# Patient Record
Sex: Male | Born: 1975 | Race: White | Hispanic: No | Marital: Single | Smoking: Former smoker
Health system: Southern US, Community
[De-identification: ages and names within clinical notes are randomized; demographics above are authoritative.]

## PROBLEM LIST (undated history)

## (undated) DIAGNOSIS — Z87898 Personal history of other specified conditions: Secondary | ICD-10-CM

## (undated) HISTORY — DX: Personal history of other specified conditions: Z87.898

---

## 2012-08-31 ENCOUNTER — Emergency Department (HOSPITAL_COMMUNITY): Payer: Self-pay

## 2012-08-31 ENCOUNTER — Encounter (HOSPITAL_COMMUNITY): Payer: Self-pay

## 2012-08-31 ENCOUNTER — Emergency Department (HOSPITAL_COMMUNITY)
Admission: EM | Admit: 2012-08-31 | Discharge: 2012-08-31 | Disposition: A | Discharge status: Home / Self Care | Payer: Self-pay | Attending: Emergency Medicine | Admitting: Emergency Medicine

## 2012-08-31 DIAGNOSIS — Y929 Unspecified place or not applicable: Secondary | ICD-10-CM | POA: Insufficient documentation

## 2012-08-31 DIAGNOSIS — IMO0002 Reserved for concepts with insufficient information to code with codable children: Secondary | ICD-10-CM | POA: Insufficient documentation

## 2012-08-31 DIAGNOSIS — Y9389 Activity, other specified: Secondary | ICD-10-CM | POA: Insufficient documentation

## 2012-08-31 DIAGNOSIS — F17200 Nicotine dependence, unspecified, uncomplicated: Secondary | ICD-10-CM | POA: Insufficient documentation

## 2012-08-31 DIAGNOSIS — T18108A Unspecified foreign body in esophagus causing other injury, initial encounter: Secondary | ICD-10-CM | POA: Insufficient documentation

## 2012-08-31 DIAGNOSIS — R1310 Dysphagia, unspecified: Secondary | ICD-10-CM | POA: Insufficient documentation

## 2012-08-31 NOTE — ED Notes (Signed)
Pt back from x-ray.

## 2012-08-31 NOTE — ED Notes (Signed)
Discharge instructions reviewed. Pt verbalized understanding.  

## 2012-08-31 NOTE — ED Provider Notes (Signed)
History     CSN: [id number]  Arrival date & time 08/31/12  6433   First MD Initiated Contact with Patient 08/31/12 [phone]      Chief Complaint  Patient presents with  . Foreign Body    (Consider location/radiation/quality/duration/timing/severity/associated sxs/prior treatment) HPI Comments: This is a 36 year old male, who presents emergency department with chief complaint of foreign body in throat. Patient states that follow a half of a sinus tablet last night, which she believes became lodged in throat. Patient and then trying to expel the capsule by vomiting. He has tried drinking fluids and eating, but since this remains. Patient states that his pain is mild, but it is worsened with swallowing. Patient denies headache, blurred vision, new hearing loss, sore throat, chest pain, shortness of breath, nausea, vomiting, diarrhea, constipation, dysuria, peripheral edema, back pain, numbness or tingling of the extremities.    The history is provided by the patient. No language interpreter was used.    History reviewed. No pertinent past medical history.  History reviewed. No pertinent past surgical history.  No family history on file.  History  Substance Use Topics  . Smoking status: Current Every Day Smoker -- 0.0 packs/day  . Smokeless tobacco: Not on file  . Alcohol Use: No      Review of Systems  All other systems reviewed and are negative.    Allergies  Review of patient's allergies indicates no known allergies.  Home Medications   Current Outpatient Rx  Name  Route  Sig  Dispense  Refill  . TYLENOL ALLERGY SINUS PO   Oral   Take 2 tablets by mouth at bedtime as needed. For congestion           BP 138/83  Pulse 88  Temp 97.5 F (36.4 C) (Oral)  Resp 20  SpO2 99%  Physical Exam  Nursing note and vitals reviewed. Constitutional: He is oriented to person, place, and time. He appears well-developed and well-nourished.  HENT:  Head: Normocephalic and  atraumatic.  Right Ear: External ear normal.  Left Ear: External ear normal.  Nose: Nose normal.  Mouth/Throat: Oropharynx is clear and moist. No oropharyngeal exudate.  Eyes: Conjunctivae normal and EOM are normal. Pupils are equal, round, and reactive to light. Right eye exhibits no discharge. Left eye exhibits no discharge. No scleral icterus.  Neck: Normal range of motion. Neck supple. No JVD present.  Cardiovascular: Normal rate, regular rhythm, normal heart sounds and intact distal pulses.  Exam reveals no gallop and no friction rub.   No murmur heard. Pulmonary/Chest: Effort normal and breath sounds normal. No respiratory distress. He has no wheezes. He has no rales. He exhibits no tenderness.  Abdominal: Soft. Bowel sounds are normal. He exhibits no distension and no mass. There is no tenderness. There is no rebound and no guarding.  Musculoskeletal: Normal range of motion. He exhibits no edema and no tenderness.  Neurological: He is alert and oriented to person, place, and time. He has normal reflexes.       CN 3-12 intact  Skin: Skin is warm and dry.  Psychiatric: He has a normal mood and affect. His behavior is normal. Judgment and thought content normal.    ED Course  Procedures (including critical care time)  Labs Reviewed - No data to display Dg Neck Soft Tissue  08/31/2012  *RADIOLOGY REPORT*  Clinical Data: Foreign body/pill stuck in throat.  NECK SOFT TISSUES - 1+ VIEW  Comparison: None.  Findings: Airway appears patent.  No radiopaque foreign body. Paravertebral soft tissues within normal limits.  No acute osseous finding.  IMPRESSION: No radiopaque foreign body in the neck. Note that many pills are not radiopaque.   Original Report Authenticated By: Jearld Lesch, M.D.      1. Foreign body in esophagus       MDM   36 year old male with sensation of foreign body in throat. Reassurance given. Patient is to followup with ENT if his symptoms do not improve.  Patient understands and agrees with the plan. Return precautions have been given. Patient is stable and ready for discharge.       Roxy Horseman, PA-C 08/31/12 1609

## 2012-08-31 NOTE — ED Notes (Signed)
Pt transported to xray via w/c

## 2012-08-31 NOTE — ED Notes (Signed)
Rob, PA back in with pt.

## 2012-08-31 NOTE — ED Notes (Signed)
Pt states he swallowed half a sinus tablet last night but feels like it is stuck in his throat. Is able to pass water and made himself vomit last night to attempt to pass the pill.

## 2012-09-04 NOTE — ED Provider Notes (Signed)
Medical screening examination/treatment/procedure(s) were conducted as a shared visit with non-physician practitioner(s) and myself.  I personally evaluated the patient during the encounter.  Patient is able to swallow. No airway compromise  Donnetta Hutching, MD 09/04/12 [phone]

## 2014-05-07 ENCOUNTER — Emergency Department (INDEPENDENT_AMBULATORY_CARE_PROVIDER_SITE_OTHER)
Admission: EM | Admit: 2014-05-07 | Discharge: 2014-05-07 | Disposition: A | Discharge status: Home / Self Care | Payer: Self-pay | Source: Home / Self Care | Attending: Emergency Medicine | Admitting: Emergency Medicine

## 2014-05-07 ENCOUNTER — Encounter (HOSPITAL_COMMUNITY): Payer: Self-pay | Admitting: Emergency Medicine

## 2014-05-07 DIAGNOSIS — IMO0001 Reserved for inherently not codable concepts without codable children: Secondary | ICD-10-CM

## 2014-05-07 DIAGNOSIS — T07XXXA Unspecified multiple injuries, initial encounter: Principal | ICD-10-CM

## 2014-05-07 DIAGNOSIS — T148 Other injury of unspecified body region: Secondary | ICD-10-CM

## 2014-05-07 DIAGNOSIS — W57XXXA Bitten or stung by nonvenomous insect and other nonvenomous arthropods, initial encounter: Secondary | ICD-10-CM

## 2014-05-07 DIAGNOSIS — L089 Local infection of the skin and subcutaneous tissue, unspecified: Secondary | ICD-10-CM

## 2014-05-07 MED ORDER — DOXYCYCLINE HYCLATE 100 MG PO CAPS: 100.0000 mg | ORAL_CAPSULE | Freq: Two times a day (BID) | ORAL | Status: DC

## 2014-05-07 MED ORDER — MOMETASONE FUROATE 0.1 % EX CREA: 1.0000 "application " | TOPICAL_CREAM | Freq: Every day | CUTANEOUS | Status: DC

## 2014-05-07 NOTE — ED Notes (Signed)
C/o spider bite on scrotum area which happened Sunday in the woods States a tick was removed on Monday morning States he is feverish and does not feel good

## 2014-05-07 NOTE — ED Provider Notes (Signed)
CSN: [id number]     Arrival date & time 05/07/14  0810 History   First MD Initiated Contact with Patient 05/07/14 0818     Chief Complaint  Patient presents with  . Insect Bite   (Consider location/radiation/quality/duration/timing/severity/associated sxs/prior Treatment) HPI Comments: 38 year old male presents complaining of insect bites to his bilateral legs and scrotum. A few days ago he was walking on a wooded property and he noticed that there are several spider webs around him. At that time, he says that he saw 6 different types of spiders on his shirt. He left the area without incident. The next day he started to have itchy spots on his right groin and on both his legs. The following day he developed some bumps on his testicles that are extremely itchy. He is mainly concerned because he says when he woke up this morning his scrotum was a blue. Additionally he states that the day after he was walking around the property he pulled a tick off his right groin, the tick was embedded, he believes he has removed the entire tick. he has also felt subjective fevers and loss of appetite. Symptoms have been constant and worsening. Other treatments tried at home. No vomiting or abdominal pain, no testicle pain. No measured fever   History reviewed. No pertinent past medical history. History reviewed. No pertinent past surgical history. History reviewed. No pertinent family history. History  Substance Use Topics  . Smoking status: Current Every Day Smoker -- 0.02 packs/day  . Smokeless tobacco: Not on file  . Alcohol Use: No    Review of Systems  Skin: Positive for rash.  All other systems reviewed and are negative.   Allergies  Review of patient's allergies indicates no known allergies.  Home Medications   Prior to Admission medications   Medication Sig Start Date End Date Taking? Authorizing Provider  Chlorphen-Pseudoephed-APAP (TYLENOL ALLERGY SINUS PO) Take 2 tablets by mouth at  bedtime as needed. For congestion    Historical Provider, MD  doxycycline (VIBRAMYCIN) 100 MG capsule Take 1 capsule (100 mg total) by mouth 2 (two) times daily. 05/07/14   Adrian Blackwater Laurelyn Terrero, PA-C  mometasone (ELOCON) 0.1 % cream Apply 1 application topically daily. 05/07/14   Adrian Blackwater Aniya Jolicoeur, PA-C   BP 134/90  Pulse 71  Temp(Src) 97.8 F (36.6 C) (Oral)  Resp 16  SpO2 100% Physical Exam  Nursing note and vitals reviewed. Constitutional: He is oriented to person, place, and time. He appears well-developed and well-nourished. No distress.  HENT:  Head: Normocephalic.  Pulmonary/Chest: Effort normal. No respiratory distress.  Genitourinary: Right testis shows no swelling and no tenderness. Left testis shows no swelling and no tenderness.  The scutum is not blue as the patient suggested, the color is normal  Lymphadenopathy:       Right: No inguinal adenopathy present.       Left: No inguinal adenopathy present.  Neurological: He is alert and oriented to person, place, and time. Coordination normal.  Skin: Skin is warm and dry. Rash noted. Rash is papular (a few 3 mm erythematous papules on the scrotum. On the bilateral lower sugars, there are multiple excoriated papules with very minimal surrounding erythema). He is not diaphoretic.  Psychiatric: He has a normal mood and affect. Judgment normal.    ED Course  Procedures (including critical care time) Labs Review Labs Reviewed - No data to display  Imaging Review No results found.   MDM   1. Insect bites of multiple sites,  infected   2. Tick bite    Given his subjective symptoms coupled with the bites, will cover with doxycycline. Also Elocon cream to help with the itching, and advised Benadryl when necessary.   Meds ordered this encounter  Medications  . doxycycline (VIBRAMYCIN) 100 MG capsule    Sig: Take 1 capsule (100 mg total) by mouth 2 (two) times daily.    Dispense:  20 capsule    Refill:  0    Order Specific  Question:  Supervising Provider    Answer:  Lorenz CoasterKELLER, DAVID C [id number]]  . mometasone (ELOCON) 0.1 % cream    Sig: Apply 1 application topically daily.    Dispense:  50 g    Refill:  0    Order Specific Question:  Supervising Provider    Answer:  Lorenz CoasterKELLER, DAVID C [[id number]]       Graylon GoodZachary H Nadalee Neiswender, PA-C 05/07/14 [phone]

## 2014-05-07 NOTE — ED Provider Notes (Signed)
Medical screening examination/treatment/procedure(s) were performed by non-physician practitioner and as supervising physician I was immediately available for consultation/collaboration.  Leslee Homeavid Courteny Egler, M.D.  Reuben Likesavid C Macaiah Mangal, MD 05/07/14 [phone]

## 2014-05-07 NOTE — Discharge Instructions (Signed)
Insect Bite Mosquitoes, flies, fleas, bedbugs, and many other insects can bite. Insect bites are different from insect stings. A sting is when venom is injected into the skin. Some insect bites can transmit infectious diseases. SYMPTOMS  Insect bites usually turn red, swell, and itch for 2 to 4 days. They often go away on their own. TREATMENT  Your caregiver may prescribe antibiotic medicines if a bacterial infection develops in the bite. HOME CARE INSTRUCTIONS  Do not scratch the bite area.  Keep the bite area clean and dry. Wash the bite area thoroughly with soap and water.  Put ice or cool compresses on the bite area.  Put ice in a plastic bag.  Place a towel between your skin and the bag.  Leave the ice on for 20 minutes, 4 times a day for the first 2 to 3 days, or as directed.  You may apply a baking soda paste, cortisone cream, or calamine lotion to the bite area as directed by your caregiver. This can help reduce itching and swelling.  Only take over-the-counter or prescription medicines as directed by your caregiver.  If you are given antibiotics, take them as directed. Finish them even if you start to feel better. You may need a tetanus shot if:  You cannot remember when you had your last tetanus shot.  You have never had a tetanus shot.  The injury broke your skin. If you get a tetanus shot, your arm may swell, get red, and feel warm to the touch. This is common and not a problem. If you need a tetanus shot and you choose not to have one, there is a rare chance of getting tetanus. Sickness from tetanus can be serious. SEEK IMMEDIATE MEDICAL CARE IF:   You have increased pain, redness, or swelling in the bite area.  You see a red line on the skin coming from the bite.  You have a fever.  You have joint pain.  You have a headache or neck pain.  You have unusual weakness.  You have a rash.  You have chest pain or shortness of breath.  You have abdominal pain,  nausea, or vomiting.  You feel unusually tired or sleepy. MAKE SURE YOU:   Understand these instructions.  Will watch your condition.  Will get help right away if you are not doing well or get worse. Document Released: 10/13/2004 Document Revised: 11/28/2011 Document Reviewed: 04/06/2011 [facility] Patient Information 2015 [place], [place]. This information is not intended to replace advice given to you by your health care provider. Make sure you discuss any questions you have with your health care provider.  Tick Bite Information Ticks are insects that attach themselves to the skin and draw blood for food. There are various types of ticks. Common types include [place] ticks and deer ticks. Most ticks live in shrubs and grassy areas. Ticks can climb onto your body when you make contact with leaves or grass where the tick is waiting. The most common places on the body for ticks to attach themselves are the scalp, neck, armpits, waist, and groin. Most tick bites are harmless, but sometimes ticks carry germs that cause diseases. These germs can be spread to a person during the tick's feeding process. The chance of a disease spreading through a tick bite depends on:   The type of tick.  Time of year.   How long the tick is attached.   Geographic location.  HOW CAN YOU PREVENT TICK BITES? Take these steps to  help prevent tick bites when you are outdoors:  Wear protective clothing. Long sleeves and long pants are best.   Wear white clothes so you can see ticks more easily.  Tuck your pant legs into your socks.   If walking on a trail, stay in the middle of the trail to avoid brushing against bushes.  Avoid walking through areas with long grass.  Put insect repellent on all exposed skin and along boot tops, pant legs, and sleeve cuffs.   Check clothing, hair, and skin repeatedly and before going inside.   Brush off any ticks that are not attached.  Take a shower or bath as soon  as possible after being outdoors.  WHAT IS THE PROPER WAY TO REMOVE A TICK? Ticks should be removed as soon as possible to help prevent diseases caused by tick bites. 1. If latex gloves are available, put them on before trying to remove a tick.  2. Using fine-point tweezers, grasp the tick as close to the skin as possible. You may also use curved forceps or a tick removal tool. Grasp the tick as close to its head as possible. Avoid grasping the tick on its body. 3. Pull gently with steady upward pressure until the tick lets go. Do not twist the tick or jerk it suddenly. This may break off the tick's head or mouth parts. 4. Do not squeeze or crush the tick's body. This could force disease-carrying fluids from the tick into your body.  5. After the tick is removed, wash the bite area and your hands with soap and water or other disinfectant such as alcohol. 6. Apply a small amount of antiseptic cream or ointment to the bite site.  7. Wash and disinfect any instruments that were used.  Do not try to remove a tick by applying a hot match, petroleum jelly, or fingernail polish to the tick. These methods do not work and may increase the chances of disease being spread from the tick bite.  WHEN SHOULD YOU SEEK MEDICAL CARE? Contact your health care provider if you are unable to remove a tick from your skin or if a part of the tick breaks off and is stuck in the skin.  After a tick bite, you need to be aware of signs and symptoms that could be related to diseases spread by ticks. Contact your health care provider if you develop any of the following in the days or weeks after the tick bite:  Unexplained fever.  Rash. A circular rash that appears days or weeks after the tick bite may indicate the possibility of Lyme disease. The rash may resemble a target with a bull's-eye and may occur at a different part of your body than the tick bite.  Redness and swelling in the area of the tick bite.   Tender,  swollen lymph glands.   Diarrhea.   Weight loss.   Cough.   Fatigue.   Muscle, joint, or bone pain.   Abdominal pain.   Headache.   Lethargy or a change in your level of consciousness.  Difficulty walking or moving your legs.   Numbness in the legs.   Paralysis.  Shortness of breath.   Confusion.   Repeated vomiting.  Document Released: 09/02/2000 Document Revised: 06/26/2013 Document Reviewed: 02/13/2013 [facility] Patient Information 2015 [place], [place]. This information is not intended to replace advice given to you by your health care provider. Make sure you discuss any questions you have with your health care provider.

## 2019-08-22 ENCOUNTER — Other Ambulatory Visit: Payer: Self-pay

## 2019-08-22 DIAGNOSIS — Z20822 Contact with and (suspected) exposure to covid-19: Secondary | ICD-10-CM

## 2019-08-24 LAB — NOVEL CORONAVIRUS, NAA: SARS-CoV-2, NAA: NOT DETECTED

## 2021-06-05 ENCOUNTER — Emergency Department
Admission: EM | Admit: 2021-06-05 | Discharge: 2021-06-05 | Disposition: A | Discharge status: Home / Self Care | Payer: Medicaid Other | Attending: Emergency Medicine | Admitting: Emergency Medicine

## 2021-06-05 ENCOUNTER — Emergency Department: Payer: Medicaid Other

## 2021-06-05 ENCOUNTER — Other Ambulatory Visit: Payer: Self-pay

## 2021-06-05 DIAGNOSIS — N50819 Testicular pain, unspecified: Secondary | ICD-10-CM

## 2021-06-05 DIAGNOSIS — R109 Unspecified abdominal pain: Secondary | ICD-10-CM

## 2021-06-05 DIAGNOSIS — N5082 Scrotal pain: Secondary | ICD-10-CM | POA: Diagnosis not present

## 2021-06-05 DIAGNOSIS — R1030 Lower abdominal pain, unspecified: Secondary | ICD-10-CM | POA: Diagnosis present

## 2021-06-05 DIAGNOSIS — F17210 Nicotine dependence, cigarettes, uncomplicated: Secondary | ICD-10-CM | POA: Insufficient documentation

## 2021-06-05 LAB — COMPREHENSIVE METABOLIC PANEL
ALT: 27 U/L (ref 0–44)
AST: 28 U/L (ref 15–41)
Albumin: 4.5 g/dL (ref 3.5–5.0)
Alkaline Phosphatase: 85 U/L (ref 38–126)
Anion gap: 15 (ref 5–15)
BUN: 21 mg/dL — ABNORMAL HIGH (ref 6–20)
CO2: 18 mmol/L — ABNORMAL LOW (ref 22–32)
Calcium: 9.8 mg/dL (ref 8.9–10.3)
Chloride: 104 mmol/L (ref 98–111)
Creatinine, Ser: 1.34 mg/dL — ABNORMAL HIGH (ref 0.61–1.24)
GFR, Estimated: >60 mL/min (ref >60)
Glucose, Bld: 113 mg/dL — ABNORMAL HIGH (ref 70–99)
Potassium: 3.9 mmol/L (ref 3.5–5.1)
Sodium: 137 mmol/L (ref 135–145)
Total Bilirubin: 0.8 mg/dL (ref 0.3–1.2)
Total Protein: 7.5 g/dL (ref 6.5–8.1)

## 2021-06-05 LAB — CBC
HCT: 47.3 % (ref 39.0–52.0)
Hemoglobin: 16.6 g/dL (ref 13.0–17.0)
MCH: 32.4 pg (ref 26.0–34.0)
MCHC: 35.1 g/dL (ref 30.0–36.0)
MCV: 92.4 fL (ref 80.0–100.0)
Platelets: 278 10*3/uL (ref 150–400)
RBC: 5.12 MIL/uL (ref 4.22–5.81)
RDW: 12.1 % (ref 11.5–15.5)
WBC: 13.8 10*3/uL — ABNORMAL HIGH (ref 4.0–10.5)
nRBC: 0 % (ref 0.0–0.2)

## 2021-06-05 LAB — LIPASE, BLOOD: Lipase: 24 U/L (ref 11–51)

## 2021-06-05 MED ORDER — FENTANYL CITRATE PF 50 MCG/ML IJ SOSY
50.0000 ug | PREFILLED_SYRINGE | INTRAMUSCULAR | Status: DC | PRN
  Administered 2021-06-05: 50 ug via INTRAVENOUS
  Filled 2021-06-05: qty 1

## 2021-06-05 MED ORDER — ONDANSETRON HCL 4 MG/2ML IJ SOLN
4.0000 mg | Freq: Once | INTRAMUSCULAR | Status: AC
  Administered 2021-06-05: 4 mg via INTRAVENOUS
  Filled 2021-06-05: qty 2

## 2021-06-05 NOTE — Discharge Instructions (Addendum)
You have only had a partial evaluation of your flank pain. You have decided to leave before a more comprehensive evaluation can be completed. You do so at risk of your condition worsening. Please consider return to the ED for re-evaluation if your symptoms return or persist.

## 2021-06-05 NOTE — ED Triage Notes (Signed)
Pt comes pov with left sided abd pain that goes into left groin and into back. Denies hx of stones or torsion. Started 2 hours ago. Pt shaking, slow to answer questions due to the pain.

## 2021-06-05 NOTE — ED Notes (Signed)
Taken to US.

## 2021-06-06 NOTE — ED Provider Notes (Signed)
[facility] Emergency Department Provider Note ____________________________________________  Time seen: 1506  I have reviewed the triage vital signs and the nursing notes.  HISTORY  Chief Complaint  Abdominal Pain, Flank Pain, and Groin Pain   HPI Dan Blankenship is a 45 y.o. male presents himself to the ED for evaluation of acute onset of left-sided abdominal and flank pain.  Patient reports referral of his pain into his left groin and scrotum.  He denies any history of kidney stones, testicular trauma, or testicular torsion.  He reports onset of symptoms about 2 hours prior to arrival.  Patient presents in significant pain and distress, slow to answer questions, shaking secondary to pain.  He denies any nausea, vomiting, hematuria, or dysuria.   History reviewed. No pertinent past medical history.  There are no problems to display for this patient.   History reviewed. No pertinent surgical history.  Prior to Admission medications   Medication Sig Start Date End Date Taking? Authorizing Provider  Chlorphen-Pseudoephed-APAP (TYLENOL ALLERGY SINUS PO) Take 2 tablets by mouth at bedtime as needed. For congestion    [provider]  doxycycline (VIBRAMYCIN) 100 MG capsule Take 1 capsule (100 mg total) by mouth 2 (two) times daily. 05/07/14   Baker, Earna Coder H, PA-C  mometasone (ELOCON) 0.1 % cream Apply 1 application topically daily. 05/07/14   Graylon Good, PA-C    Allergies Patient has no known allergies.  History reviewed. No pertinent family history.  Social History Social History   Tobacco Use   Smoking status: Every Day    Packs/day: 0.02    Types: Cigarettes  Substance Use Topics   Alcohol use: No   Drug use: No    Review of Systems  Constitutional: Negative for fever. Eyes: Negative for visual changes. ENT: Negative for sore throat. Cardiovascular: Negative for chest pain. Respiratory: Negative for shortness of  breath. Gastrointestinal: Negative for abdominal pain, vomiting and diarrhea. Left flank pain.  Genitourinary: Negative for dysuria. Left scrotal pain Musculoskeletal: Negative for back pain. Skin: Negative for rash. Neurological: Negative for headaches, focal weakness or numbness. ____________________________________________  PHYSICAL EXAM:  VITAL SIGNS: ED Triage Vitals  Enc Vitals Group     BP 06/05/21 1044 (!) 152/106     Pulse Rate 06/05/21 1044 (!) 108     Resp 06/05/21 1044 (!) 25     Temp 06/05/21 1520 97.8 F (36.6 C)     Temp Source 06/05/21 1520 Oral     SpO2 06/05/21 1044 96 %     Weight 06/05/21 1044 160 lb (72.6 kg)     Height 06/05/21 1044 5\' 6"  (1.676 m)     Head Circumference --      Peak Flow --      Pain Score 06/05/21 1044 10     Pain Loc --      Pain Edu? --      Excl. in GC? --     Constitutional: Alert and oriented. Well appearing and in no distress. Head: Normocephalic and atraumatic. Eyes: Conjunctivae are normal. Normal extraocular movements Cardiovascular: Normal rate, regular rhythm. Normal distal pulses. Respiratory: Normal respiratory effort. No wheezes/rales/rhonchi. Gastrointestinal: Soft and nontender. No distention.  Normal bowel sounds appreciated.  No flank pain elicited. Musculoskeletal: Nontender with normal range of motion in all extremities.  Neurologic:  Normal gait without ataxia. Normal speech and language. No gross focal neurologic deficits are appreciated. Skin:  Skin is warm, dry and intact. No rash noted. Psychiatric: Mood and affect  are normal. Patient exhibits appropriate insight and judgment. ____________________________________________    {LABS (pertinent positives/negatives)  Labs Reviewed  CBC - Abnormal; Notable for the following components:      Result Value   WBC 13.8 (*)    All other components within normal limits  COMPREHENSIVE METABOLIC PANEL - Abnormal; Notable for the following components:   CO2 18 (*)     Glucose, Bld 113 (*)    BUN 21 (*)    Creatinine, Ser 1.34 (*)    All other components within normal limits  LIPASE, BLOOD  ____________________________________________  {EKG  ____________________________________________   RADIOLOGY Official radiology report(s):   US Scrotum w/ Doppler  IMPRESSION: 1. No evidence of testicular torsion. Normal size and ultrasound appearance of the testicles. Arterial and venous Doppler flow is present bilaterally. 2. Diffuse bilateral scrotal thickening. Correlate for local evidence of infection or inflammation. ____________________________________________  PROCEDURES  Fentanyl 50 mcg IVP Zofran 4 mg IVP  Procedures ____________________________________________   INITIAL IMPRESSION / ASSESSMENT AND PLAN / ED COURSE  As part of my medical decision making, I reviewed the following data within the electronic MEDICAL RECORD NUMBER Labs reviewed as noted, Radiograph reviewed as noted, and Notes from prior ED visits   DDX: testicular torsion, nephrolithiasis, urethritis  Patient ED evaluation of acute onset left flank and testicular pain, evaluated for the ED emergently.  He had an ultrasound performed which did not indicate any acute torsion but did show some bilateral scrotal thickening.  Patient reported improvement of his pain significantly at the time of this evaluation.  He did however declined any further imaging, and decided to leave before his full work-up being completed.  Patient was aware that he was leaving against best medical advice, as he could still have a serious underlying infection causing his symptoms.  Patient was discharged and encouraged to return to the ED soon as possible.  Dan Blankenship was evaluated in Emergency Department on 06/06/2021 for the symptoms described in the history of present illness. He was evaluated in the context of the global COVID-19 pandemic, which necessitated consideration that the patient might be at risk for  infection with the SARS-CoV-2 virus that causes COVID-19. Institutional protocols and algorithms that pertain to the evaluation of patients at risk for COVID-19 are in a state of rapid change based on information released by regulatory bodies including the CDC and federal and state organizations. These policies and algorithms were followed during the patient's care in the ED. ____________________________________________  FINAL CLINICAL IMPRESSION(S) / ED DIAGNOSES  Final diagnoses:  Left flank pain  Scrotal pain      Keenya Matera, Charlesetta Ivory, PA-C 06/06/21 2147    Dionne Bucy, MD 06/14/21 0710

## 2022-10-01 IMAGING — US US SCROTUM W/ DOPPLER COMPLETE
1 series · 13 of 25 positions shown · non-contrast
Comparison: None.

CLINICAL DATA: Testicular pain for 1 day, rule out torsion

EXAM:
SCROTAL ULTRASOUND
DOPPLER ULTRASOUND OF THE TESTICLES
TECHNIQUE: Complete ultrasound examination of the testicles, epididymis, and
other scrotal structures was performed. Color and spectral Doppler
ultrasound were also utilized to evaluate blood flow to the
testicles.

[Series 1: us scrotum w/doppler · 13 of 90 slices shown]
[im 1/90]
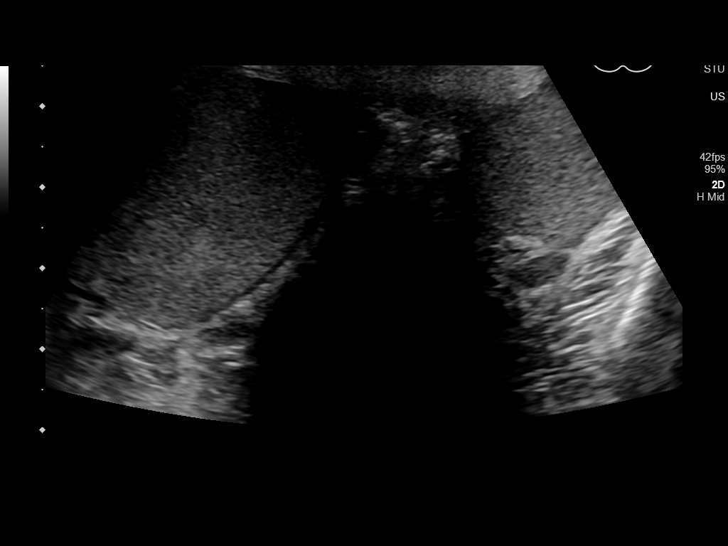
[im 8/90]
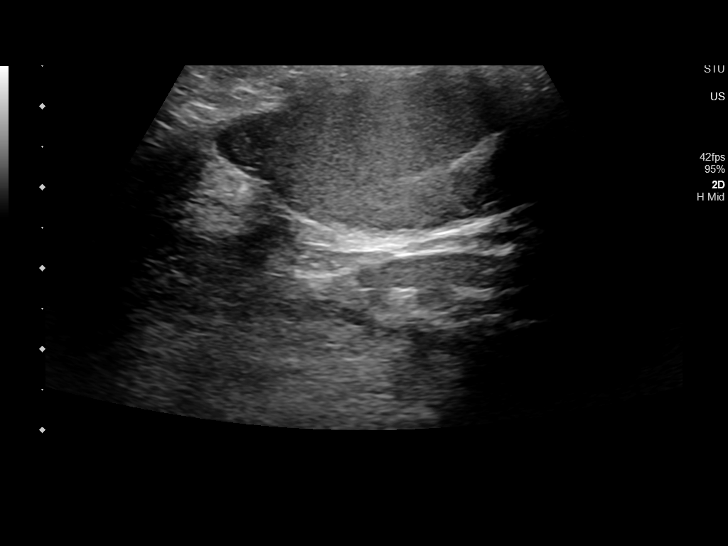
[im 15/90]
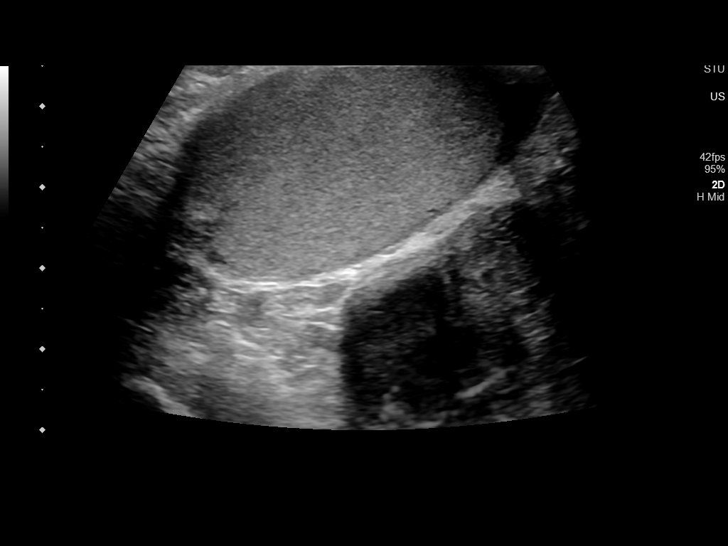
[im 23/90]
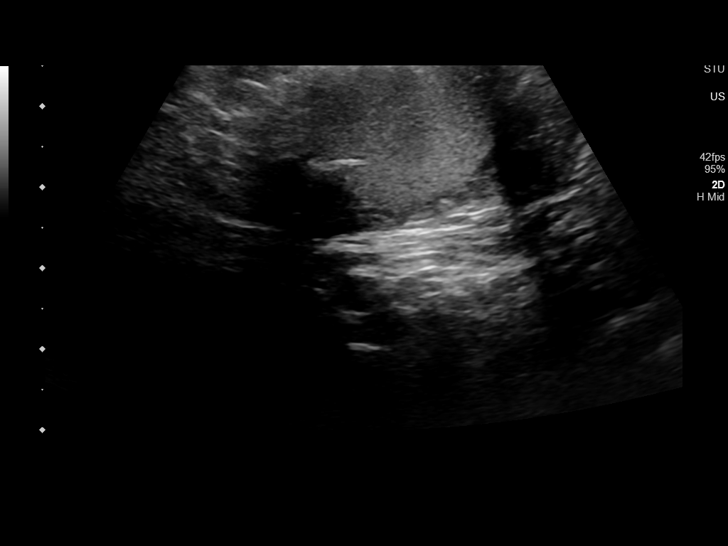
[im 30/90]
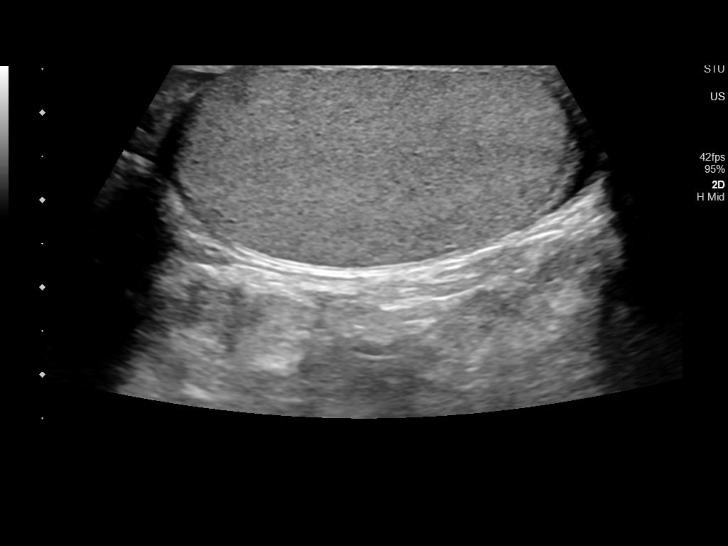
[im 38/90]
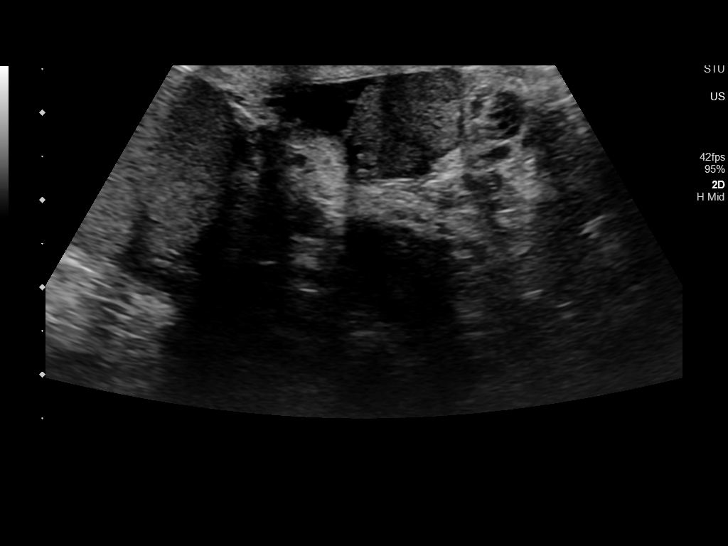
[im 45/90]
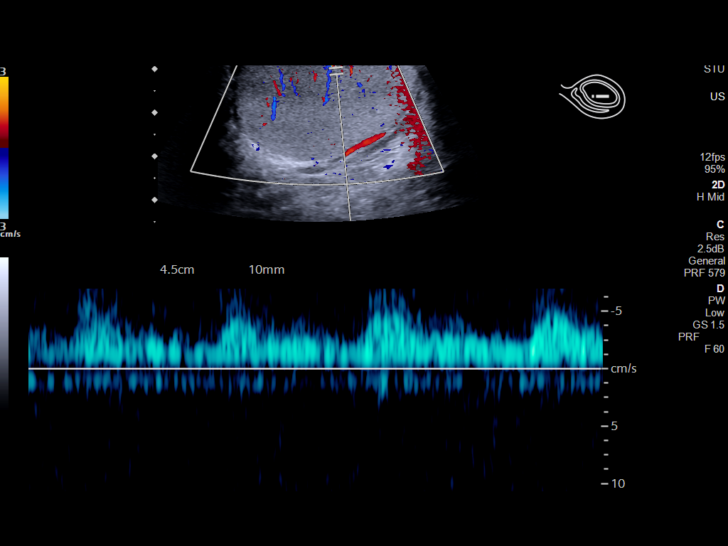
[im 52/90]
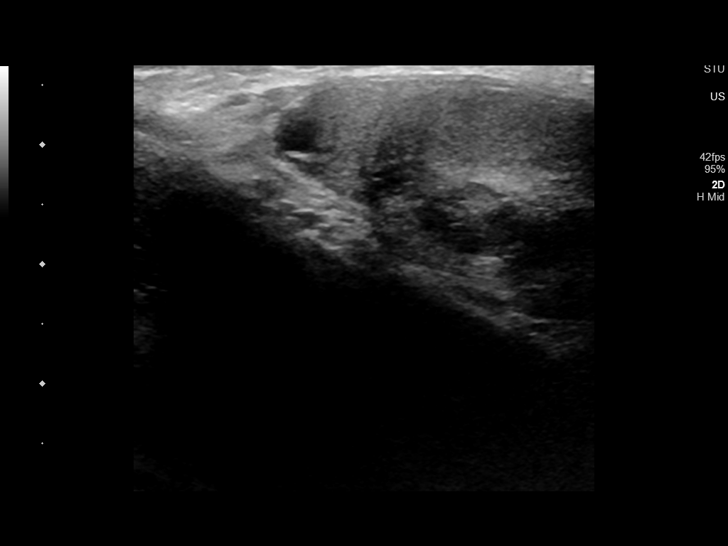
[im 60/90]
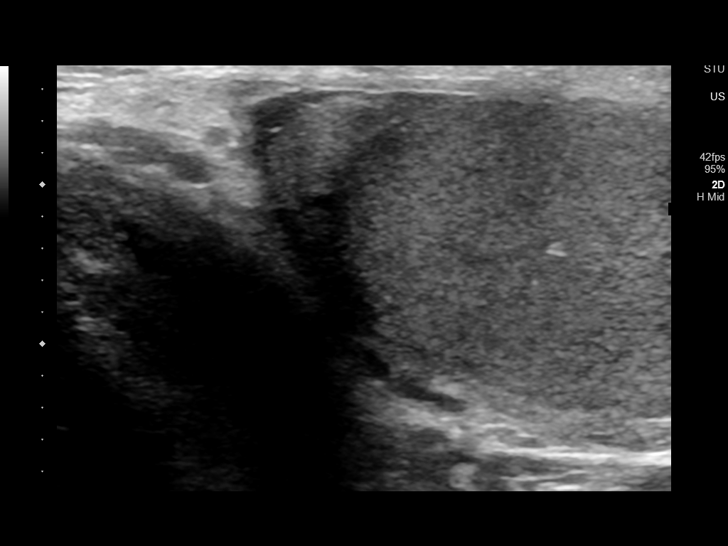
[im 67/90]
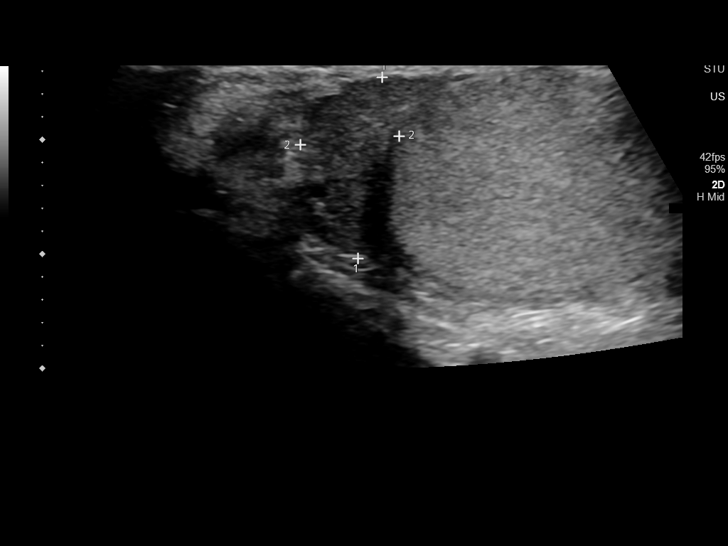
[im 75/90]
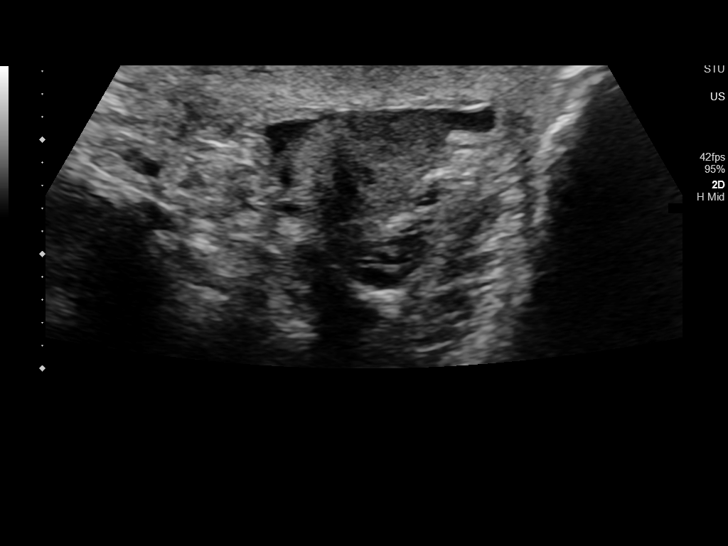
[im 82/90]
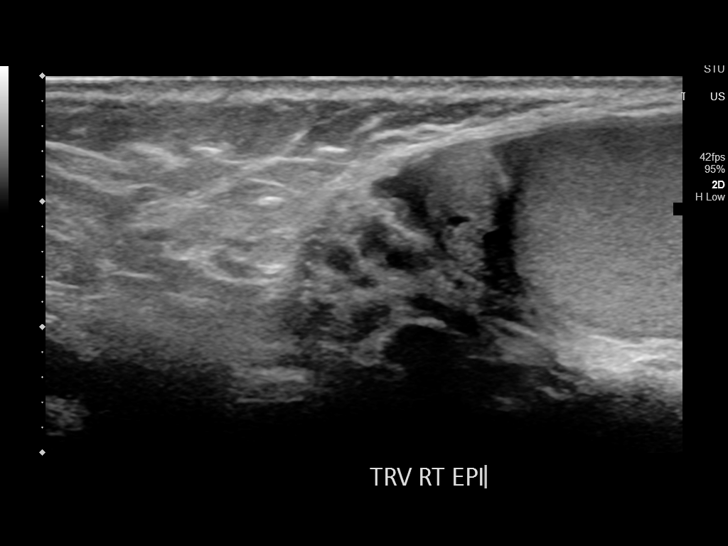
[im 90/90]
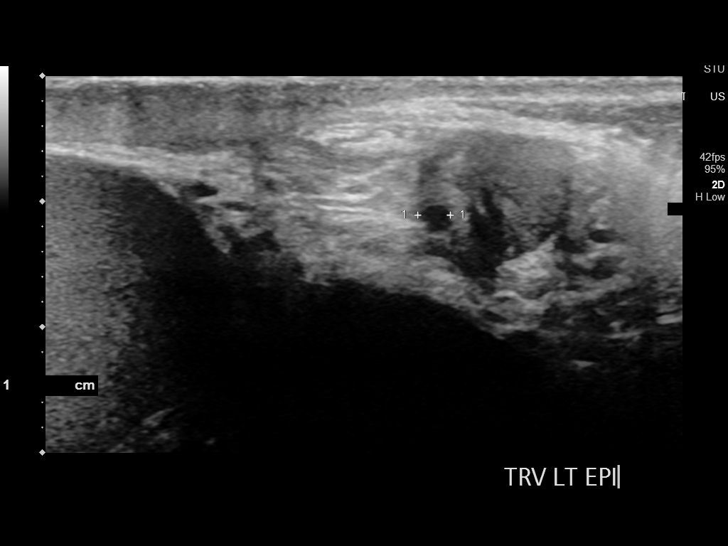

[13 of 25 positions shown; findings below may reference images not displayed]

FINDINGS: Right testicle

Measurements: 4.1 x 2.2 x 3.9 cm. No mass or microlithiasis
visualized.

Left testicle

Measurements: 4.2 x 2.6 x 3.1 cm. No mass or microlithiasis
visualized.

Right epididymis: Normal in size and appearance. Incidental benign
tiny subcentimeter cyst or spermatocele.

Left epididymis: Normal in size and appearance. Incidental benign
tiny subcentimeter cyst or spermatocele.

Hydrocele:  None visualized.

Varicocele:  None visualized.

Pulsed Doppler interrogation of both testes demonstrates normal low
resistance arterial and venous waveforms bilaterally.

Other: Diffuse bilateral scrotal thickening.
IMPRESSION: 1. No evidence of testicular torsion. Normal size and ultrasound
appearance of the testicles. Arterial and venous Doppler flow is
present bilaterally.
2. Diffuse bilateral scrotal thickening. Correlate for local
evidence of infection or inflammation.

## 2023-04-13 DIAGNOSIS — H5213 Myopia, bilateral: Secondary | ICD-10-CM | POA: Diagnosis not present

## 2023-12-01 ENCOUNTER — Ambulatory Visit: Payer: BLUE CROSS/BLUE SHIELD | Admitting: Family Medicine

## 2023-12-01 ENCOUNTER — Other Ambulatory Visit (HOSPITAL_COMMUNITY)
Admission: RE | Admit: 2023-12-01 | Discharge: 2023-12-01 | Disposition: A | Discharge status: Home / Self Care | Source: Ambulatory Visit | Attending: Family Medicine | Admitting: Family Medicine

## 2023-12-01 ENCOUNTER — Encounter: Payer: Self-pay | Admitting: Family Medicine

## 2023-12-01 VITALS — BP 134/85 | HR 76 | Ht 64.0 in | Wt 177.3 lb

## 2023-12-01 DIAGNOSIS — Z113 Encounter for screening for infections with a predominantly sexual mode of transmission: Secondary | ICD-10-CM

## 2023-12-01 DIAGNOSIS — R030 Elevated blood-pressure reading, without diagnosis of hypertension: Secondary | ICD-10-CM | POA: Diagnosis not present

## 2023-12-01 DIAGNOSIS — M79621 Pain in right upper arm: Secondary | ICD-10-CM

## 2023-12-01 DIAGNOSIS — E66811 Obesity, class 1: Secondary | ICD-10-CM | POA: Diagnosis not present

## 2023-12-01 DIAGNOSIS — Z87898 Personal history of other specified conditions: Secondary | ICD-10-CM

## 2023-12-01 DIAGNOSIS — E6609 Other obesity due to excess calories: Secondary | ICD-10-CM | POA: Diagnosis not present

## 2023-12-01 DIAGNOSIS — M79622 Pain in left upper arm: Secondary | ICD-10-CM

## 2023-12-01 DIAGNOSIS — Z7689 Persons encountering health services in other specified circumstances: Secondary | ICD-10-CM

## 2023-12-01 DIAGNOSIS — Z6830 Body mass index (BMI) 30.0-30.9, adult: Secondary | ICD-10-CM | POA: Diagnosis not present

## 2023-12-01 DIAGNOSIS — Z1159 Encounter for screening for other viral diseases: Secondary | ICD-10-CM

## 2023-12-01 DIAGNOSIS — M25562 Pain in left knee: Secondary | ICD-10-CM

## 2023-12-01 DIAGNOSIS — G8929 Other chronic pain: Secondary | ICD-10-CM | POA: Diagnosis not present

## 2023-12-01 DIAGNOSIS — Z114 Encounter for screening for human immunodeficiency virus [HIV]: Secondary | ICD-10-CM | POA: Diagnosis not present

## 2023-12-01 DIAGNOSIS — Z1211 Encounter for screening for malignant neoplasm of colon: Secondary | ICD-10-CM

## 2023-12-01 NOTE — Progress Notes (Signed)
 New Patient Office Visit  Introduced to nurse practitioner role and practice setting.  All questions answered.  Discussed provider/patient relationship and expectations.   Subjective    Patient ID: Dan Blankenship, male    DOB: [birth year]  Age: 48 y.o. MRN: [id number]  CC:  Chief Complaint  Patient presents with   New Patient (Initial Visit)   HPI Dan Blankenship presents to establish care with primary provider.   History of IV substance use, including methamphetamines, acid, cocaine, and marijuana, has been clean since June 28, 2022. No current use of alcohol or tobacco since October 2023.  He experiences bilateral arm pain, described as excruciating, particularly in the mornings. The pain is located at the 'ball of the muscle' and has been present for months. The pain improves with physical activity, such as pushups. He is concerned about potential vascular issues or scar tissue from past drug use.  He also has knee pain that began approximately eight months ago after feeling like he knelt on a screw, although no screw was present. He describes a burning sensation and a growth across the knee area.  He would like a good lab work up and to know what he needs to work on to be healthier.  Outpatient Encounter Medications as of 12/01/2023  Medication Sig   [DISCONTINUED] Chlorphen-Pseudoephed-APAP (TYLENOL ALLERGY SINUS PO) Take 2 tablets by mouth at bedtime as needed. For congestion (Patient not taking: Reported on 12/01/2023)   [DISCONTINUED] doxycycline (VIBRAMYCIN) 100 MG capsule Take 1 capsule (100 mg total) by mouth 2 (two) times daily. (Patient not taking: Reported on 12/01/2023)   [DISCONTINUED] mometasone (ELOCON) 0.1 % cream Apply 1 application topically daily. (Patient not taking: Reported on 12/01/2023)   No facility-administered encounter medications on file as of 12/01/2023.    Past Medical History:  Diagnosis Date   History of substance use     History reviewed. No pertinent  surgical history.  History reviewed. No pertinent family history.  Social History   Socioeconomic History   Marital status: Single    Spouse name: Not on file   Number of children: Not on file   Years of education: Not on file   Highest education level: Not on file  Occupational History   Not on file  Tobacco Use   Smoking status: Former    Current packs/day: 0.00    Types: Cigarettes    Quit date: 06/28/2022    Years since quitting: 1.4   Smokeless tobacco: Not on file  Substance and Sexual Activity   Alcohol use: No   Drug use: No   Sexual activity: Not on file  Other Topics Concern   Not on file  Social History Narrative   Not on file   Social Drivers of Health   Financial Resource Strain: Not on file  Food Insecurity: Not on file  Transportation Needs: Not on file  Physical Activity: Not on file  Stress: Not on file  Social Connections: Not on file  Intimate Partner Violence: Not on file    Review of Systems  All other systems reviewed and are negative.       Objective    BP 134/85   Pulse 76   Ht 5\' 4"  (1.626 m)   Wt 177 lb 4.8 oz (80.4 kg)   SpO2 100%   BMI 30.43 kg/m   Physical Exam Constitutional:      General: He is not in acute distress.    Appearance: Normal appearance. He is overweight. He  is not ill-appearing, toxic-appearing or diaphoretic.  HENT:     Head: Normocephalic.     Right Ear: Tympanic membrane normal.     Left Ear: Tympanic membrane normal.     Nose: Nose normal.     Mouth/Throat:     Mouth: Mucous membranes are moist.  Eyes:     General:        Right eye: No discharge.        Left eye: No discharge.     Extraocular Movements: Extraocular movements intact.     Conjunctiva/sclera: Conjunctivae normal.     Pupils: Pupils are equal, round, and reactive to light.  Cardiovascular:     Rate and Rhythm: Normal rate and regular rhythm.     Pulses:          Radial pulses are 2+ on the right side and 2+ on the left side.        Dorsalis pedis pulses are 2+ on the right side and 2+ on the left side.       Posterior tibial pulses are 2+ on the right side and 2+ on the left side.     Heart sounds: No murmur heard.    No friction rub. No gallop.     Comments: +2 Bilateral brachial Pulmonary:     Effort: Pulmonary effort is normal. No respiratory distress.     Breath sounds: Normal breath sounds. No stridor. No wheezing, rhonchi or rales.  Chest:     Chest wall: No tenderness.  Abdominal:     Tenderness: There is no right CVA tenderness or left CVA tenderness.  Musculoskeletal:        General: No swelling, tenderness, deformity or signs of injury.     Right knee: No swelling, deformity, effusion, erythema, ecchymosis, lacerations, bony tenderness or crepitus. Normal range of motion. No tenderness. Normal alignment.     Left knee: Crepitus present. No swelling, deformity, effusion, erythema, ecchymosis, lacerations or bony tenderness. Normal range of motion. No tenderness. Normal alignment.     Right lower leg: No edema.     Left lower leg: No edema.     Comments: Above bilateral AC to palpation mild tenderness and appear to have scar tissue built up.  Mild crepitus to L knee, but no obvious swelling, erythema, bruising, or malformation.  Skin:    General: Skin is warm and dry.     Capillary Refill: Capillary refill takes less than 2 seconds.  Neurological:     General: No focal deficit present.     Mental Status: He is alert and oriented to person, place, and time. Mental status is at baseline.     Cranial Nerves: No cranial nerve deficit.     Sensory: No sensory deficit.     Motor: No weakness.     Coordination: Coordination normal.     Gait: Gait normal.  Psychiatric:        Mood and Affect: Mood normal.        Behavior: Behavior normal. Behavior is cooperative.        Thought Content: Thought content normal.        Judgment: Judgment normal.         Assessment & Plan:   Pain in both upper  arms Assessment & Plan: Chronic bilateral arm pain with potential vascular changes or muscle damage due to IV drug use.  Normal pulses and coloring, but scarred tissue areas noted above AC.  Full strength present, pain worsens with lifting. -  Order bilateral arm ultrasounds to assess for/ rule out vascular changes.  Orders: -     VAS [place] UPPER EXTREMITY VENOUS DUPLEX; Future -     VAS [place] UPPER EXTREMITY ARTERIAL DUPLEX; Future  Elevated blood pressure reading in office without diagnosis of hypertension Assessment & Plan: Elevated No known hx of HTN GOAL<120/80 Low sodium diet, increase water intake, exercise, and weight loss Monitor at home with upper arm cuff, L arm, automatic   Orders: -     Comprehensive metabolic panel -     Lipid panel  Chronic pain of left knee Assessment & Plan: Chronic knee pain for 8 months, possibly due to soft tissue injury/irritation to patella.  Conservative management discussed. - Recommend heating, icing, and elevating the knee. - Advise use of over-the-counter Voltaren gel for pain relief. - Suggest taking Tylenol for pain management. - Strengthen muscles around knee, as long as no pain - okay to exercise - Offered knee x-ray if symptoms persist or worsen.   Class 1 obesity due to excess calories without serious comorbidity with body mass index (BMI) of 30.0 to 30.9 in adult Assessment & Plan: Recommend increasing weekly exercise - 150 minutes of purposeful movement outside of work Will check lipid, A1C, TSH, Cmp  Recommend well balanced diet, water drink of choice  Orders: -     TSH -     CBC -     Hemoglobin A1c  Encounter to establish care with new doctor Assessment & Plan: Welcome to [facility]- has never had a primary care provider before Concerns today: establish care with PCP, bilateral pain in arms, and L knee pain Will screening for hep c, HIV, STI testing, and routine labs Ordered cologuard Recommend Tdap Recommend regular dental  and eye care F/u 3 months    History of intravenous drug use in remission Assessment & Plan: In remission for about 17 months - IV drug use (cocaine, meth, mushrooms, marijuana, acid) Works at [employer] center Keep up great work!  Orders: -     VAS [place] UPPER EXTREMITY VENOUS DUPLEX; Future -     VAS [place] UPPER EXTREMITY ARTERIAL DUPLEX; Future  Need for hepatitis C screening test -     Hepatitis C antibody  Encounter for screening for HIV -     HIV Antibody (routine testing w rflx)  Routine screening for STI (sexually transmitted infection) -     RPR -     HIV Antibody (routine testing w rflx) -     Urine cytology ancillary only  Screening for colon cancer -     Cologuard   Return in about 3 months (around 03/02/2024) for BP check.   I, Sallee Provencal, FNP, have reviewed all documentation for this visit. The documentation on 12/03/23 for the exam, diagnosis, procedures, and orders are all accurate and complete.   Sallee Provencal, FNP

## 2023-12-02 LAB — COMPREHENSIVE METABOLIC PANEL
ALT: 21 IU/L (ref 0–44)
AST: 16 IU/L (ref 0–40)
Albumin: 4.2 g/dL (ref 4.1–5.1)
Alkaline Phosphatase: 71 IU/L (ref 44–121)
BUN/Creatinine Ratio: 9 (ref 9–20)
BUN: 10 mg/dL (ref 6–24)
Bilirubin Total: 0.4 mg/dL (ref 0.0–1.2)
CO2: 20 mmol/L (ref 20–29)
Calcium: 9.5 mg/dL (ref 8.7–10.2)
Chloride: 106 mmol/L (ref 96–106)
Creatinine, Ser: 1.1 mg/dL (ref 0.76–1.27)
Globulin, Total: 2.4 g/dL (ref 1.5–4.5)
Glucose: 80 mg/dL (ref 70–99)
Potassium: 4.8 mmol/L (ref 3.5–5.2)
Sodium: 141 mmol/L (ref 134–144)
Total Protein: 6.6 g/dL (ref 6.0–8.5)
eGFR: 84 mL/min/{1.73_m2} (ref >59)

## 2023-12-02 LAB — LIPID PANEL
Chol/HDL Ratio: 3.5 ratio (ref 0.0–5.0)
Cholesterol, Total: 123 mg/dL (ref 100–199)
HDL: 35 mg/dL — ABNORMAL LOW (ref >39)
LDL Chol Calc (NIH): 67 mg/dL (ref 0–99)
Triglycerides: 116 mg/dL (ref 0–149)
VLDL Cholesterol Cal: 21 mg/dL (ref 5–40)

## 2023-12-02 LAB — HIV ANTIBODY (ROUTINE TESTING W REFLEX): HIV Screen 4th Generation wRfx: NONREACTIVE

## 2023-12-02 LAB — CBC
Hematocrit: 44 % (ref 37.5–51.0)
Hemoglobin: 14.8 g/dL (ref 13.0–17.7)
MCH: 30.5 pg (ref 26.6–33.0)
MCHC: 33.6 g/dL (ref 31.5–35.7)
MCV: 91 fL (ref 79–97)
Platelets: 235 10*3/uL (ref 150–450)
RBC: 4.86 x10E6/uL (ref 4.14–5.80)
RDW: 11.7 % (ref 11.6–15.4)
WBC: 5.9 10*3/uL (ref 3.4–10.8)

## 2023-12-02 LAB — HEMOGLOBIN A1C
Est. average glucose Bld gHb Est-mCnc: 111 mg/dL
Hgb A1c MFr Bld: 5.5 % (ref 4.8–5.6)

## 2023-12-02 LAB — TSH: TSH: 0.862 u[IU]/mL (ref 0.450–4.500)

## 2023-12-02 LAB — HEPATITIS C ANTIBODY: Hep C Virus Ab: NONREACTIVE

## 2023-12-03 ENCOUNTER — Encounter: Payer: Self-pay | Admitting: Family Medicine

## 2023-12-03 DIAGNOSIS — Z7689 Persons encountering health services in other specified circumstances: Secondary | ICD-10-CM | POA: Insufficient documentation

## 2023-12-03 DIAGNOSIS — E66811 Obesity, class 1: Secondary | ICD-10-CM | POA: Insufficient documentation

## 2023-12-03 DIAGNOSIS — R030 Elevated blood-pressure reading, without diagnosis of hypertension: Secondary | ICD-10-CM | POA: Insufficient documentation

## 2023-12-03 DIAGNOSIS — M79621 Pain in right upper arm: Secondary | ICD-10-CM | POA: Insufficient documentation

## 2023-12-03 DIAGNOSIS — Z87898 Personal history of other specified conditions: Secondary | ICD-10-CM | POA: Insufficient documentation

## 2023-12-03 DIAGNOSIS — G8929 Other chronic pain: Secondary | ICD-10-CM | POA: Insufficient documentation

## 2023-12-03 DIAGNOSIS — M79622 Pain in left upper arm: Secondary | ICD-10-CM | POA: Insufficient documentation

## 2023-12-03 NOTE — Assessment & Plan Note (Addendum)
 Chronic knee pain for 8 months, possibly due to soft tissue injury/irritation to patella.  Conservative management discussed. - Recommend heating, icing, and elevating the knee. - Advise use of over-the-counter Voltaren gel for pain relief. - Suggest taking Tylenol for pain management. - Strengthen muscles around knee, as long as no pain - okay to exercise - Offered knee x-ray if symptoms persist or worsen.

## 2023-12-03 NOTE — Assessment & Plan Note (Addendum)
 In remission for about 17 months - IV drug use (cocaine, meth, mushrooms, marijuana, acid) Works at [employer] center Keep up great work!

## 2023-12-03 NOTE — Assessment & Plan Note (Signed)
 Chronic bilateral arm pain with potential vascular changes or muscle damage due to IV drug use.  Normal pulses and coloring, but scarred tissue areas noted above AC.  Full strength present, pain worsens with lifting. - Order bilateral arm ultrasounds to assess for/ rule out vascular changes.

## 2023-12-03 NOTE — Assessment & Plan Note (Addendum)
 Welcome to [facility]- has never had a primary care provider before Concerns today: establish care with PCP, bilateral pain in arms, and L knee pain Will screening for hep c, HIV, STI testing, and routine labs Ordered cologuard Recommend Tdap Recommend regular dental and eye care F/u 3 months

## 2023-12-03 NOTE — Assessment & Plan Note (Signed)
 Elevated No known hx of HTN GOAL<120/80 Low sodium diet, increase water intake, exercise, and weight loss Monitor at home with upper arm cuff, L arm, automatic

## 2023-12-03 NOTE — Assessment & Plan Note (Signed)
 Recommend increasing weekly exercise - 150 minutes of purposeful movement outside of work Will check lipid, A1C, TSH, Cmp  Recommend well balanced diet, water drink of choice

## 2023-12-04 ENCOUNTER — Encounter: Payer: Self-pay | Admitting: Family Medicine

## 2023-12-04 ENCOUNTER — Telehealth: Payer: Self-pay | Admitting: Family Medicine

## 2023-12-04 NOTE — Telephone Encounter (Signed)
 Sent pt message for more information on HDL.

## 2023-12-04 NOTE — Telephone Encounter (Signed)
 Patient called and advised HDL is the good cholesterol and to take the Omega 3 supplement daily. He verbalized understanding.   Copied from CRM [id number]. Topic: Clinical - Lab/Test Results >> Dec 04, 2023 12:31 PM Fuller Mandril wrote: Reason for CRM: Patient returned missed call. Provided lab note as written by provider. Patient under stood. Would like to know more about HDL. Thank You

## 2023-12-05 LAB — URINE CYTOLOGY ANCILLARY ONLY
Chlamydia: NEGATIVE
Comment: NEGATIVE
Comment: NORMAL
Neisseria Gonorrhea: NEGATIVE

## 2024-03-01 ENCOUNTER — Ambulatory Visit: Admitting: Family Medicine

## 2024-03-06 ENCOUNTER — Ambulatory Visit (INDEPENDENT_AMBULATORY_CARE_PROVIDER_SITE_OTHER)

## 2024-03-06 ENCOUNTER — Other Ambulatory Visit (INDEPENDENT_AMBULATORY_CARE_PROVIDER_SITE_OTHER)

## 2024-03-06 ENCOUNTER — Encounter: Payer: Self-pay | Admitting: Family Medicine

## 2024-03-06 ENCOUNTER — Ambulatory Visit: Admitting: Family Medicine

## 2024-03-06 VITALS — BP 119/85 | HR 77 | Ht 65.0 in | Wt 172.6 lb

## 2024-03-06 DIAGNOSIS — Z1211 Encounter for screening for malignant neoplasm of colon: Secondary | ICD-10-CM

## 2024-03-06 DIAGNOSIS — M79622 Pain in left upper arm: Secondary | ICD-10-CM | POA: Diagnosis not present

## 2024-03-06 DIAGNOSIS — Z87898 Personal history of other specified conditions: Secondary | ICD-10-CM

## 2024-03-06 DIAGNOSIS — M79621 Pain in right upper arm: Secondary | ICD-10-CM | POA: Diagnosis not present

## 2024-03-06 NOTE — Progress Notes (Signed)
 Established Patient Office Visit  Introduced to nurse practitioner role and practice setting.  All questions answered.  Discussed provider/patient relationship and expectations.   Subjective   Patient ID: Dan Blankenship, male    DOB: Oct 12, 1975  Age: 48 y.o. MRN: [id number]  Chief Complaint  Patient presents with   Medical Management of Chronic Issues    Patient is present for 3 month follow-up. He is not taking any medications, no smoking. No symptoms to report. Monitors blood pressure on occassions    Discussed the use of AI scribe software for clinical note transcription with the patient, who gave verbal consent to proceed.  History of Present Illness Dan Blankenship is a 48 year old male with hypertension who presents for blood pressure management and follow-up on arm pain.  He has been monitoring his blood pressure at home, with readings 110s-140s/60s-70s. No visual changes, headaches, changes in urination, or chest pain. Endorses though he has not checked blood pressures at home in while.   He completed vascular imaging for his arms today. His arm pain has decreased compared to before. He attributes improvement to cessation of IV drug use.  No issues with recent lab work, including no diabetes. He is excited about life and has no current concerns.      12/01/2023    3:40 PM  Depression screen PHQ 2/9  Decreased Interest 0  Down, Depressed, Hopeless 0  PHQ - 2 Score 0  Altered sleeping 0  Tired, decreased energy 0  Change in appetite 0  Feeling bad or failure about yourself  0  Trouble concentrating 0  Moving slowly or fidgety/restless 0  Suicidal thoughts 0  PHQ-9 Score 0  Difficult doing work/chores Not difficult at all       12/01/2023    3:40 PM  GAD 7 : Generalized Anxiety Score  Nervous, Anxious, on Edge 0  Control/stop worrying 0  Worry too much - different things 0  Trouble relaxing 0  Restless 0  Easily annoyed or irritable 0  Afraid - awful might happen 0  Total  GAD 7 Score 0  Anxiety Difficulty Not difficult at all     Review of Systems  All other systems reviewed and are negative.   Negative unless indicated in HPI   Objective:     BP 119/85 (BP Location: Right Arm, Patient Position: Sitting, Cuff Size: Large)   Pulse 77   Ht 5' 5 (1.651 m)   Wt 172 lb 9.6 oz (78.3 kg)   SpO2 97%   BMI 28.72 kg/m    Physical Exam Constitutional:      General: He is not in acute distress.    Appearance: Normal appearance. He is overweight. He is not ill-appearing, toxic-appearing or diaphoretic.  HENT:     Head: Normocephalic.     Nose: Nose normal.     Mouth/Throat:     Mouth: Mucous membranes are moist.   Eyes:     Extraocular Movements: Extraocular movements intact.     Conjunctiva/sclera: Conjunctivae normal.     Pupils: Pupils are equal, round, and reactive to light.   Neck:     Vascular: No carotid bruit.   Cardiovascular:     Rate and Rhythm: Normal rate and regular rhythm.     Pulses: Normal pulses.     Heart sounds: Normal heart sounds. No murmur heard.    No friction rub. No gallop.  Pulmonary:     Effort: Pulmonary effort is normal. No respiratory distress.  Breath sounds: Normal breath sounds. No stridor. No wheezing, rhonchi or rales.  Chest:     Chest wall: No tenderness.   Musculoskeletal:        General: No swelling or tenderness.     Cervical back: Normal range of motion. No rigidity or tenderness.     Right lower leg: No edema.     Left lower leg: No edema.  Lymphadenopathy:     Cervical: No cervical adenopathy.   Skin:    General: Skin is warm and dry.     Capillary Refill: Capillary refill takes less than 2 seconds.   Neurological:     General: No focal deficit present.     Mental Status: He is alert and oriented to person, place, and time. Mental status is at baseline.     Cranial Nerves: No cranial nerve deficit.     Sensory: No sensory deficit.     Motor: No weakness.     Coordination:  Coordination normal.     Gait: Gait normal.   Psychiatric:        Mood and Affect: Mood normal.        Behavior: Behavior normal.        Thought Content: Thought content normal.        Judgment: Judgment normal.      No results found for any visits on 03/06/24.  Last CBC Lab Results  Component Value Date   WBC 5.9 12/01/2023   HGB 14.8 12/01/2023   HCT 44.0 12/01/2023   MCV 91 12/01/2023   MCH 30.5 12/01/2023   RDW 11.7 12/01/2023   PLT 235 12/01/2023   Last metabolic panel Lab Results  Component Value Date   GLUCOSE 80 12/01/2023   NA 141 12/01/2023   K 4.8 12/01/2023   CL 106 12/01/2023   CO2 20 12/01/2023   BUN 10 12/01/2023   CREATININE 1.10 12/01/2023   EGFR 84 12/01/2023   CALCIUM 9.5 12/01/2023   PROT 6.6 12/01/2023   ALBUMIN 4.2 12/01/2023   LABGLOB 2.4 12/01/2023   BILITOT 0.4 12/01/2023   ALKPHOS 71 12/01/2023   AST 16 12/01/2023   ALT 21 12/01/2023   ANIONGAP 15 06/05/2021   Last lipids Lab Results  Component Value Date   CHOL 123 12/01/2023   HDL 35 (L) 12/01/2023   LDLCALC 67 12/01/2023   TRIG 116 12/01/2023   CHOLHDL 3.5 12/01/2023   Last hemoglobin A1c Lab Results  Component Value Date   HGBA1C 5.5 12/01/2023   Last thyroid  functions Lab Results  Component Value Date   TSH 0.862 12/01/2023      The ASCVD Risk score (Arnett DK, et al., 2019) failed to calculate for the following reasons:   The valid total cholesterol range is 130 to 320 mg/dL    Assessment & Plan:  Screening for colon cancer    Assessment and Plan Assessment & Plan Arm Pain Intermittent pain improved. Awaiting imaging results to assess for any vascular issues given IV drug use hx.  Pulses +2, sensation intact, no skin discoloration. - Review imaging results once available. - Follow up if imaging suggests further intervention.  Hypertension Blood pressure ranges from 110s-140s/60s-70s at home. Has not monitored in a while Denies vision changes,  headaches, chest pain, urine changes Today 119/85 - Shared decision making - continue lifestyle changes (low sodium, water drink of choice, exercise, weight loss) GOAL<120/80 - Monitor blood pressure weekly with upper arm automatic cuff. - low threshold to start daily medication  if blood pressure remains elevated.  General Health Maintenance Recent labs normal. Cologuard test pending for pt to send in . - Submit Cologuard test. - Schedule physical examination in six months.  Return in about 6 months (around 09/05/2024) for annual physical.   I, Tasia Farr, FNP, have reviewed all documentation for this visit. The documentation on 03/06/24 for the exam, diagnosis, procedures, and orders are all accurate and complete.   Tasia Farr, FNP

## 2024-03-11 ENCOUNTER — Ambulatory Visit: Payer: Self-pay | Admitting: Family Medicine

## 2024-04-09 ENCOUNTER — Ambulatory Visit
Admission: EM | Admit: 2024-04-09 | Discharge: 2024-04-09 | Attending: Emergency Medicine | Admitting: Emergency Medicine

## 2024-04-09 ENCOUNTER — Emergency Department
Admission: EM | Admit: 2024-04-09 | Discharge: 2024-04-09 | Disposition: A | Discharge status: Home / Self Care | Source: Other Acute Inpatient Hospital | Attending: Emergency Medicine | Admitting: Emergency Medicine

## 2024-04-09 ENCOUNTER — Encounter: Payer: Self-pay | Admitting: Obstetrics and Gynecology

## 2024-04-09 ENCOUNTER — Other Ambulatory Visit: Payer: Self-pay

## 2024-04-09 DIAGNOSIS — T63441A Toxic effect of venom of bees, accidental (unintentional), initial encounter: Secondary | ICD-10-CM | POA: Diagnosis not present

## 2024-04-09 DIAGNOSIS — T7840XA Allergy, unspecified, initial encounter: Secondary | ICD-10-CM | POA: Diagnosis not present

## 2024-04-09 DIAGNOSIS — L500 Allergic urticaria: Secondary | ICD-10-CM | POA: Insufficient documentation

## 2024-04-09 DIAGNOSIS — I10 Essential (primary) hypertension: Secondary | ICD-10-CM | POA: Insufficient documentation

## 2024-04-09 DIAGNOSIS — L509 Urticaria, unspecified: Secondary | ICD-10-CM | POA: Diagnosis not present

## 2024-04-09 DIAGNOSIS — T782XXA Anaphylactic shock, unspecified, initial encounter: Secondary | ICD-10-CM

## 2024-04-09 DIAGNOSIS — R609 Edema, unspecified: Secondary | ICD-10-CM | POA: Diagnosis not present

## 2024-04-09 DIAGNOSIS — T783XXA Angioneurotic edema, initial encounter: Secondary | ICD-10-CM | POA: Diagnosis not present

## 2024-04-09 MED ORDER — METHYLPREDNISOLONE SODIUM SUCC 125 MG IJ SOLR: 125.0000 mg | Freq: Once | INTRAMUSCULAR | Status: DC

## 2024-04-09 MED ORDER — EPINEPHRINE 0.3 MG/0.3ML IJ SOAJ: 0.3000 mg | INTRAMUSCULAR | 1 refills | Status: AC | PRN

## 2024-04-09 MED ORDER — FAMOTIDINE 40 MG PO TABS
40.0000 mg | ORAL_TABLET | Freq: Once | ORAL | Status: AC
  Administered 2024-04-09: 40 mg via ORAL

## 2024-04-09 MED ORDER — EPINEPHRINE 0.3 MG/0.3ML IJ SOAJ
0.3000 mg | Freq: Once | INTRAMUSCULAR | Status: AC
  Administered 2024-04-09: 0.3 mg via INTRAMUSCULAR

## 2024-04-09 MED ORDER — PREDNISONE 50 MG PO TABS: 50.0000 mg | ORAL_TABLET | Freq: Every day | ORAL | 0 refills | Status: AC

## 2024-04-09 MED ORDER — DEXAMETHASONE SODIUM PHOSPHATE 10 MG/ML IJ SOLN
10.0000 mg | Freq: Once | INTRAMUSCULAR | Status: AC
  Administered 2024-04-09: 10 mg via INTRAVENOUS

## 2024-04-09 NOTE — ED Triage Notes (Addendum)
 Pt presents to UC d/t being stuck by bees on face & head 35 mins ago. Is c/o hives, knot in chest,closing of throat & sob. Took 50 mg of benadryl w/o relief.

## 2024-04-09 NOTE — ED Notes (Signed)
 Patient is being discharged from the Urgent Care and sent to the Emergency Department via EMS . Per Dr.Mortenson, patient is in need of higher level of care due to Anaphylactic reaction . Patient is aware and verbalizes understanding of plan of care.  Vitals:   04/09/24 1624  BP: (!) 152/100  Pulse: 92  Resp: 16  Temp: 98.6 F (37 C)  SpO2: 92%

## 2024-04-09 NOTE — ED Provider Notes (Signed)
 [facility] Provider Note   Event Date/Time   First MD Initiated Contact with Patient 04/09/24 1735     (approximate) History  Allergic Reaction  HPI Dan Blankenship is a 48 y.o. male with past medical history of hypertension who presents from urgent care after an allergic reaction likely secondary to bee stings.  Patient states that he was installing an air conditioner unit today when he felt to bee stings on his forehead and behind his ear.  Patient had been complaining of of tongue swelling, hives on the back and neck, difficulty swallowing, and a knot in his throat.  Patient states the symptoms have improved after being administered Decadron , epinephrine , famotidine  at approximately 1630 today. ROS: Patient currently denies any vision changes, tinnitus, difficulty speaking, facial droop, sore throat, chest pain, shortness of breath, abdominal pain, nausea/vomiting/diarrhea, dysuria, or weakness/numbness/paresthesias in any extremity   Physical Exam  Triage Vital Signs: ED Triage Vitals  Encounter Vitals Group     BP 04/09/24 1736 (!) 145/101     Girls Systolic BP Percentile --      Girls Diastolic BP Percentile --      Boys Systolic BP Percentile --      Boys Diastolic BP Percentile --      Pulse Rate 04/09/24 1736 67     Resp 04/09/24 1736 16     Temp 04/09/24 1736 98.8 F (37.1 C)     Temp Source 04/09/24 1736 Oral     SpO2 04/09/24 1736 98 %     Weight 04/09/24 1737 180 lb 1.9 oz (81.7 kg)     Height 04/09/24 1737 5' 5 (1.651 m)     Head Circumference --      Peak Flow --      Pain Score 04/09/24 1737 0     Pain Loc --      Pain Education --      Exclude from Growth Chart --    Most recent vital signs: Vitals:   04/09/24 1830 04/09/24 2030  BP: 122/78 122/87  Pulse: 65 65  Resp:  16  Temp:  97.9 F (36.6 C)  SpO2: 99% 97%   General: Awake, oriented x4. CV:  Good peripheral perfusion. Resp:  Normal effort. Abd:  No  distention. Other:  Middle-aged overweight Caucasian male resting comfortably in no acute distress.  Hives over posterior neck ED Results / Procedures / Treatments  Labs (all labs ordered are listed, but only abnormal results are displayed) Labs Reviewed - No data to display PROCEDURES: Critical Care performed: No Procedures MEDICATIONS ORDERED IN ED: Medications - No data to display IMPRESSION / MDM / ASSESSMENT AND PLAN / ED COURSE  I reviewed the triage vital signs and the nursing notes.                             The patient is on the cardiac monitor to evaluate for evidence of arrhythmia and/or significant heart rate changes. Patient's presentation is most consistent with acute presentation with potential threat to life or bodily function. + Cutaneous hives/erythema No evidence of multiorgan involvement  Given history and exam, presentation most consistent with allergic reaction. I have low suspicion for toxic shock syndrome, anaphylaxis, asthma exacerbation, or drug toxicity. Rx: Prednisone  60mg  qday x3days, Benadryl 25mg  q8hr x3days Disposition: Discharge home with SRP. Follow up with PCP in 1-2 days.   FINAL CLINICAL IMPRESSION(S) / ED DIAGNOSES   Final  diagnoses:  Allergic reaction, initial encounter  Bee sting, accidental or unintentional, initial encounter   Rx / DC Orders   ED Discharge Orders          Ordered    EPINEPHrine  (EPIPEN  2-PAK) 0.3 mg/0.3 mL IJ SOAJ injection  As needed        04/09/24 2128    predniSONE  (DELTASONE ) 50 MG tablet  Daily with breakfast        04/09/24 2128           Note:  This document was prepared using Dragon voice recognition software and may include unintentional dictation errors.   Jossie Artist POUR, MD 04/09/24 2129

## 2024-04-09 NOTE — ED Provider Notes (Addendum)
 HPI  SUBJECTIVE:  Dan Blankenship is a 48 y.o. male who presents with a severe allergic reaction after being stung multiple times in the face by an unknown bee.  This occurred at approximately 1600.  States his face is erythematous, swollen, states that his entire body itches, and that he feels a tight knot in his throat with difficulty swallowing.  No lip or tongue swelling, coughing, wheezing, shortness of breath, nausea, vomiting, diarrhea, lightheadedness, dizziness, syncope.  He took Benadryl 50 mg without improvement of symptoms.  No aggravating factors.  He has no past medical history.  No history of anaphylaxis, allergies, asthma, hypertension.    Past Medical History:  Diagnosis Date   History of substance use     History reviewed. No pertinent surgical history.  History reviewed. No pertinent family history.  Social History   Tobacco Use   Smoking status: Former    Current packs/day: 0.00    Types: Cigarettes    Quit date: 06/28/2022    Years since quitting: 1.7  Substance Use Topics   Alcohol use: No   Drug use: No    No current facility-administered medications for this encounter. No current outpatient medications on file.  No Known Allergies   ROS  As noted in HPI.   Physical Exam  BP (!) 152/100 (BP Location: Right Arm)   Pulse 92   Temp 98.6 F (37 C) (Oral)   Resp 16   Ht 5' 5 (1.651 m)   Wt 81.6 kg   SpO2 92%   BMI 29.95 kg/m   Constitutional: Well developed, well nourished, appears anxious.  Having difficulty swallowing saliva.  Spitting into the sink. Eyes: PERRL, EOMI, conjunctiva normal bilaterally HENT: Normocephalic, atraumatic,mucus membranes moist.  Face flushed, puffy.  No angioedema of the lips, tongue, uvula. Respiratory: Clear to auscultation bilaterally, no rales, no wheezing, no rhonchi Cardiovascular: Normal rate and rhythm, no murmurs, no gallops, no rubs GI: Nondistended, normal bowel sounds  skin: Multiple urticarial wheals  over face and neck       Diffuse urticaria with excoriations on back  Musculoskeletal: No edema, no tenderness, no deformities Neurologic: Alert & oriented x 3, CN III-XII grossly intact, no motor deficits, sensation grossly intact Psychiatric: Speech and behavior appropriate   ED Course   Medications  EPINEPHrine  (EPI-PEN) injection 0.3 mg (0.3 mg Intramuscular Given 04/09/24 1632)  famotidine  (PEPCID ) tablet 40 mg (40 mg Oral Given 04/09/24 1631)  dexamethasone  (DECADRON ) injection 10 mg (10 mg Intravenous Given 04/09/24 1640)    Orders Placed This Encounter  Procedures   Insert peripheral IV    Standing Status:   Standing    Number of Occurrences:   1   No results found for this or any previous visit (from the past 24 hours). No results found.  ED Clinical Impression  1. Anaphylaxis, initial encounter      ED Assessment/Plan     Pt seen immediately on arrival.  Due to the life-threatening nature of his presentation.  There was concern for impending anaphylaxis as patient's voice was getting muffled and he was spitting into his sink, unable to swallow his saliva patient was given Pepcid  40 mg p.o., dexamethasone  10 mg IV, epinephrine  EpiPen  0.3 mg IM into the thigh, and a 20-gauge was started in the right AC.  Reevaluation, patient started to get less flushed, states that it is becoming easier to swallow.  He is no longer spitting into the sink.  Lungs still clear.  Transferring to the  emergency department for post epinephrine  injection observation.  Discussed with patient that he would be there for 4 to 6 hours.  Gave report to fire/first responders and EMS.  Patient was transferred to the ED in stable condition.  Meds ordered this encounter  Medications   EPINEPHrine  (EPI-PEN) injection 0.3 mg   DISCONTD: methylPREDNISolone  sodium succinate (SOLU-MEDROL ) 125 mg/2 mL injection 125 mg   famotidine  (PEPCID ) tablet 40 mg   dexamethasone  (DECADRON ) injection 10 mg       *This clinic note was created using Scientist, clinical (histocompatibility and immunogenetics). Therefore, there may be occasional mistakes despite careful proofreading. ?    Van Knee, MD 04/09/24 1655    Van Knee, MD 04/09/24 1707    Van Knee, MD 04/09/24 [phone]

## 2024-04-09 NOTE — Discharge Instructions (Addendum)
 Please use over-the-counter Benadryl 25 mg - 50 mg every 8 hours for the next 3 days

## 2024-04-09 NOTE — ED Notes (Signed)
 Patient is being discharged from the Urgent Care and sent to the Emergency Department via EMS . Per Dr. Kriste, patient is in need of higher level of care due to allergic reaction . Patient is aware and verbalizes understanding of plan of care.  Vitals:   04/09/24 1624  BP: (!) 152/100  Pulse: 92  Resp: 16  Temp: 98.6 F (37 C)  SpO2: 92%

## 2024-04-09 NOTE — ED Triage Notes (Signed)
 Pt BIB [facility] from [facility] for an Allergic reaction to bee stings. Pt took Benadryl prior to arriving at [facility] UC, at [facility] pt received Decadron , Epinepherine, famotidine  at 1630. Pt was complaining of angioedema, hives, trouble swallowing and a knot in throat/chest. Pt denied SOB with UC. Upon arrival to ED all symptoms have resolved, pt stating he is able to swallow but does feel a knot in throat intermittently. Denies any CP/SOB. Vitals : 123/82, 98% RA, HR 69

## 2024-09-02 DIAGNOSIS — R197 Diarrhea, unspecified: Secondary | ICD-10-CM | POA: Diagnosis not present

## 2024-09-02 DIAGNOSIS — R059 Cough, unspecified: Secondary | ICD-10-CM | POA: Diagnosis not present

## 2024-09-02 DIAGNOSIS — R112 Nausea with vomiting, unspecified: Secondary | ICD-10-CM | POA: Diagnosis not present

## 2024-09-05 ENCOUNTER — Ambulatory Visit: Admitting: Family Medicine

## 2024-09-05 ENCOUNTER — Encounter

## 2024-09-09 ENCOUNTER — Encounter
